# Patient Record
Sex: Male | Born: 1985 | Hispanic: Yes | Smoking: Current every day smoker
Health system: Southern US, Community
[De-identification: ages and names within clinical notes are randomized; demographics above are authoritative.]

## PROBLEM LIST (undated history)

## (undated) DIAGNOSIS — E785 Hyperlipidemia, unspecified: Secondary | ICD-10-CM

## (undated) DIAGNOSIS — I10 Essential (primary) hypertension: Secondary | ICD-10-CM

---

## 2020-07-19 ENCOUNTER — Encounter: Payer: Self-pay | Admitting: Emergency Medicine

## 2020-07-19 ENCOUNTER — Other Ambulatory Visit: Payer: Self-pay

## 2020-07-19 ENCOUNTER — Emergency Department: Payer: Self-pay

## 2020-07-19 DIAGNOSIS — F17210 Nicotine dependence, cigarettes, uncomplicated: Secondary | ICD-10-CM | POA: Insufficient documentation

## 2020-07-19 DIAGNOSIS — Z20822 Contact with and (suspected) exposure to covid-19: Secondary | ICD-10-CM | POA: Insufficient documentation

## 2020-07-19 DIAGNOSIS — I10 Essential (primary) hypertension: Secondary | ICD-10-CM | POA: Insufficient documentation

## 2020-07-19 DIAGNOSIS — R0789 Other chest pain: Secondary | ICD-10-CM | POA: Insufficient documentation

## 2020-07-19 LAB — BASIC METABOLIC PANEL
Anion gap: 12 (ref 5–15)
BUN: 18 mg/dL (ref 6–20)
CO2: 21 mmol/L — ABNORMAL LOW (ref 22–32)
Calcium: 8.6 mg/dL — ABNORMAL LOW (ref 8.9–10.3)
Chloride: 107 mmol/L (ref 98–111)
Creatinine, Ser: 0.75 mg/dL (ref 0.61–1.24)
GFR, Estimated: >60 mL/min (ref >60)
Glucose, Bld: 103 mg/dL — ABNORMAL HIGH (ref 70–99)
Potassium: 3.8 mmol/L (ref 3.5–5.1)
Sodium: 140 mmol/L (ref 135–145)

## 2020-07-19 LAB — CBC
HCT: 43 % (ref 39.0–52.0)
Hemoglobin: 15.3 g/dL (ref 13.0–17.0)
MCH: 31.5 pg (ref 26.0–34.0)
MCHC: 35.6 g/dL (ref 30.0–36.0)
MCV: 88.7 fL (ref 80.0–100.0)
Platelets: 300 10*3/uL (ref 150–400)
RBC: 4.85 MIL/uL (ref 4.22–5.81)
RDW: 12 % (ref 11.5–15.5)
WBC: 10.4 10*3/uL (ref 4.0–10.5)
nRBC: 0 % (ref 0.0–0.2)

## 2020-07-19 LAB — TROPONIN I (HIGH SENSITIVITY)
Troponin I (High Sensitivity): 5 ng/L (ref <18)
Troponin I (High Sensitivity): 5 ng/L (ref <18)

## 2020-07-19 NOTE — ED Triage Notes (Signed)
Patient to ER for c/o chest pain (mid-left) since Saturday. 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 States pain feels worse to him today. Denies any shortness of breath, nausea, or diaphoresis.

## 2020-07-20 ENCOUNTER — Emergency Department
Admission: EM | Admit: 2020-07-20 | Discharge: 2020-07-20 | Disposition: A | Discharge status: Home / Self Care | Payer: Self-pay | Attending: Emergency Medicine | Admitting: Emergency Medicine

## 2020-07-20 DIAGNOSIS — R079 Chest pain, unspecified: Secondary | ICD-10-CM

## 2020-07-20 DIAGNOSIS — I10 Essential (primary) hypertension: Secondary | ICD-10-CM

## 2020-07-20 HISTORY — DX: Essential (primary) hypertension: I10

## 2020-07-20 HISTORY — DX: Hyperlipidemia, unspecified: E78.5

## 2020-07-20 LAB — HEPATIC FUNCTION PANEL
ALT: 22 U/L (ref 0–44)
AST: 22 U/L (ref 15–41)
Albumin: 4 g/dL (ref 3.5–5.0)
Alkaline Phosphatase: 77 U/L (ref 38–126)
Bilirubin, Direct: 0.2 mg/dL (ref 0.0–0.2)
Indirect Bilirubin: 0.8 mg/dL (ref 0.3–0.9)
Total Bilirubin: 1 mg/dL (ref 0.3–1.2)
Total Protein: 6.7 g/dL (ref 6.5–8.1)

## 2020-07-20 LAB — RESPIRATORY PANEL BY RT PCR (FLU A&B, COVID)
Influenza A by PCR: NEGATIVE
Influenza B by PCR: NEGATIVE
SARS Coronavirus 2 by RT PCR: NEGATIVE

## 2020-07-20 LAB — LIPASE, BLOOD: Lipase: 30 U/L (ref 11–51)

## 2020-07-20 LAB — FIBRIN DERIVATIVES D-DIMER (ARMC ONLY): Fibrin derivatives D-dimer (ARMC): 242.23 ng/mL (FEU) (ref 0.00–499.00)

## 2020-07-20 MED ORDER — FAMOTIDINE IN NACL 20-0.9 MG/50ML-% IV SOLN
20.0000 mg | Freq: Once | INTRAVENOUS | Status: AC
  Administered 2020-07-20: 20 mg via INTRAVENOUS
  Filled 2020-07-20: qty 50

## 2020-07-20 MED ORDER — KETOROLAC TROMETHAMINE 30 MG/ML IJ SOLN
15.0000 mg | Freq: Once | INTRAMUSCULAR | Status: AC
  Administered 2020-07-20: 15 mg via INTRAVENOUS
  Filled 2020-07-20: qty 1

## 2020-07-20 MED ORDER — IBUPROFEN 800 MG PO TABS: 800.0000 mg | ORAL_TABLET | Freq: Three times a day (TID) | ORAL | 0 refills | Status: AC | PRN

## 2020-07-20 MED ORDER — HYDROCODONE-ACETAMINOPHEN 5-325 MG PO TABS: 1.0000 | ORAL_TABLET | Freq: Four times a day (QID) | ORAL | 0 refills | Status: AC | PRN

## 2020-07-20 MED ORDER — ATORVASTATIN CALCIUM 20 MG PO TABS: 20.0000 mg | ORAL_TABLET | Freq: Every day | ORAL | 0 refills | Status: AC

## 2020-07-20 MED ORDER — AMLODIPINE BESYLATE 5 MG PO TABS: 5.0000 mg | ORAL_TABLET | Freq: Every day | ORAL | 0 refills | Status: AC

## 2020-07-20 NOTE — ED Notes (Signed)
Pt reports pain decreased from 8/10 to 0/10 after Toradol.  Pt able to move left arm without increase in pain, no pain with deep breath.  PIV removed intact with site covered, no bleeding noted.  VSS.  Pt given work note to RTW on 07/21/20

## 2020-07-20 NOTE — ED Notes (Signed)
Pt lying awake in bed, responds appropriately to verbal stimuli.  Pt c/o 8/10 pain from under left arm to base of shoulder.  Patient sts that pain radiates into back, not into shoulder, pt with increased pain with palpation of chest wall close to armpit.  Pt denies n/v, no diaphoresis, no change or increase in pain with eating or lifting objects.  Pt is A&Ox4, MD aware of pain, pt denies change in pain after pepcid IV complete.

## 2020-07-20 NOTE — Discharge Instructions (Signed)
1.  You may take pain medicines as needed (Motrin/Norco #15). 2.  Apply moist heat to affected area several times daily. 3.  Start Amlodipine 5 mg daily for blood pressure. 4.  Start Lipitor 20 mg daily for cholesterol. 5.  Return to the ER for worsening symptoms, persistent vomiting, difficulty breathing or other concerns.

## 2020-08-07 NOTE — ED Provider Notes (Signed)
First MD Initiated Contact with Patient 07/20/20 0154     (approximate)  I have reviewed the triage vital signs and the nursing notes.   HISTORY  Chief Complaint Chest Pain    HPI Ricardo Mason is a 34 y.o. male who presents to the ED from home with a chief complaint of chest pain.  Patient has a history of hypertension and hyperlipidemia he has not taking his medicines in 7 to 8 months.  Reports left-sided chest pain x4 days, waxing/waning.  Describes pain as burning and aching, nonradiating, not associated with diaphoresis, shortness of breath, nausea/vomiting or dizziness.  Denies recent fever, chills, cough, abdominal pain, dysuria, diarrhea.  Denies recent travel or trauma.         Past Medical History:  Diagnosis Date  . Hyperlipidemia   . Hypertension     There are no problems to display for this patient.   History reviewed. No pertinent surgical history.  Prior to Admission medications   Not on File    Allergies Patient has no known allergies.  No family history on file.  Social History Social History        Tobacco Use  . Smoking status: Current Every Day Smoker    Types: Cigarettes  . Smokeless tobacco: Never Used  Substance Use Topics  . Alcohol use: Yes  . Drug use: Not on file    Review of Systems  Constitutional: No fever/chills Eyes: No visual changes. ENT: No sore throat. Cardiovascular: Positive for chest pain. Respiratory: Denies shortness of breath. Gastrointestinal: No abdominal pain.  No nausea, no vomiting.  No diarrhea.  No constipation. Genitourinary: Negative for dysuria. Musculoskeletal: Negative for back pain. Skin: Negative for rash. Neurological: Negative for headaches, focal weakness or numbness.   ____________________________________________   PHYSICAL EXAM:  VITAL SIGNS:    ED Triage Vitals [07/19/20 1540]  Enc Vitals Group     BP (!) 143/91     Pulse Rate (!) 110     Resp 20      Temp 98.2 F (36.8 C)     Temp Source Oral     SpO2 94 %     Weight 285 lb (129.3 kg)     Height 5\' 11"  (1.803 m)     Head Circumference      Peak Flow      Pain Score 8     Pain Loc      Pain Edu?      Excl. in GC?     Constitutional: Alert and oriented. Well appearing and in no acute distress. Eyes: Conjunctivae are normal. PERRL. EOMI. Head: Atraumatic. Nose: No congestion/rhinnorhea. Mouth/Throat: Mucous membranes are moist.   Neck: No stridor.   Cardiovascular: Normal rate, regular rhythm. Grossly normal heart sounds.  Good peripheral circulation. Respiratory: Normal respiratory effort.  No retractions. Lungs CTAB. Gastrointestinal: Soft and nontender to light or deep palpation. No distention. No abdominal bruits. No CVA tenderness. Musculoskeletal: No lower extremity tenderness nor edema.  No joint effusions. Neurologic:  Normal speech and language. No gross focal neurologic deficits are appreciated. No gait instability. Skin:  Skin is warm, dry and intact. No rash noted. Psychiatric: Mood and affect are normal. Speech and behavior are normal.  ____________________________________________   LABS (all labs ordered are listed, but only abnormal results are displayed)       Labs Reviewed  BASIC METABOLIC PANEL - Abnormal; Notable for the following components:      Result Value    CO2 21 (*)  Glucose, Bld 103 (*)    Calcium 8.6 (*)    All other components within normal limits  CBC  HEPATIC FUNCTION PANEL  LIPASE, BLOOD  FIBRIN DERIVATIVES D-DIMER ([facility] ONLY)  TROPONIN I (HIGH SENSITIVITY)  TROPONIN I (HIGH SENSITIVITY)   ____________________________________________  EKG  ED ECG REPORT I, Kedar Sedano J, the attending physician, personally viewed and interpreted this ECG.   Date: 07/20/2020  EKG Time: 1528  Rate: 113  Rhythm: sinus tachycardia  Axis: Normal  Intervals:none  ST&T Change:  Nonspecific  ____________________________________________  RADIOLOGY I, Kali Ambler J, personally viewed and evaluated these images (plain radiographs) as part of my medical decision making, as well as reviewing the written report by the radiologist.  ED MD interpretation: No acute cardiopulmonary process  Official radiology report(s): DG Chest 2 View  Result Date: 07/19/2020 CLINICAL DATA:  Chest pain. EXAM: CHEST - 2 VIEW COMPARISON:  None. FINDINGS: The heart size and mediastinal contours are within normal limits. Both lungs are clear. No visible pleural effusions or pneumothorax. The visualized skeletal structures are unremarkable. IMPRESSION: No active cardiopulmonary disease. Electronically Signed   By: Feliberto HartsFrederick S Jones MD   On: 07/19/2020 16:19    ____________________________________________   PROCEDURES  Procedure(s) performed (including Critical Care):  Procedures   ____________________________________________   INITIAL IMPRESSION / ASSESSMENT AND PLAN / ED COURSE  As part of my medical decision making, I reviewed the following data within the electronic MEDICAL RECORD NUMBER Nursing notes reviewed and incorporated, Labs reviewed, EKG interpreted, Old chart reviewed, Radiograph reviewed and Notes from prior ED visits     34 year old male with hypertension and hyperlipidemia not on meds who presents with a 4-day history of chest pain. Differential diagnosis includes, but is not limited to, ACS, aortic dissection, pulmonary embolism, cardiac tamponade, pneumothorax, pneumonia, pericarditis, myocarditis, GI-related causes including esophagitis/gastritis, and musculoskeletal chest wall pain.    EKG and 2 sets of troponins unremarkable.  Will check LFTs/lipase, D-dimer, Covid swab.  Administer IV Pepcid and reassess.    ____________________________________________   FINAL CLINICAL IMPRESSION(S) / ED DIAGNOSES  Final diagnoses:  Nonspecific chest pain         ED Discharge Orders    None      *Please note:  Ricardo Mason was evaluated in Emergency Department on 07/20/2020 for the symptoms described in the history of present illness. He was evaluated in the context of the global COVID-19 pandemic, which necessitated consideration that the patient might be at risk for infection with the SARS-CoV-2 virus that causes COVID-19. Institutional protocols and algorithms that pertain to the evaluation of patients at risk for COVID-19 are in a state of rapid change based on information released by regulatory bodies including the CDC and federal and state organizations. These policies and algorithms were followed during the patient's care in the ED.  Some ED evaluations and interventions may be delayed as a result of limited staffing during and the pandemic.*   Note:  This document was prepared using Dragon voice recognition software and may include unintentional dictation errors.     First MD Initiated Contact with Patient 07/20/20 0154     (approximate)  I have reviewed the triage vital signs and the nursing notes.   HISTORY  Chief Complaint Chest Pain    HPI Ricardo Mason is a 34 y.o. male who presents to the ED from home with a chief complaint of chest pain.  Patient has a history of hypertension and hyperlipidemia he has not taking his medicines in 7  to 8 months.  Reports left-sided chest pain x4 days, waxing/waning.  Describes pain as burning and aching, nonradiating, not associated with diaphoresis, shortness of breath, nausea/vomiting or dizziness.  Denies recent fever, chills, cough, abdominal pain, dysuria, diarrhea.  Denies recent travel or trauma.         Past Medical History:  Diagnosis Date  . Hyperlipidemia   . Hypertension     There are no problems to display for this patient.   History reviewed. No pertinent surgical history.  Prior to Admission medications   Not on File    Allergies Patient  has no known allergies.  No family history on file.  Social History Social History        Tobacco Use  . Smoking status: Current Every Day Smoker    Types: Cigarettes  . Smokeless tobacco: Never Used  Substance Use Topics  . Alcohol use: Yes  . Drug use: Not on file    Review of Systems  Constitutional: No fever/chills Eyes: No visual changes. ENT: No sore throat. Cardiovascular: Positive for chest pain. Respiratory: Denies shortness of breath. Gastrointestinal: No abdominal pain.  No nausea, no vomiting.  No diarrhea.  No constipation. Genitourinary: Negative for dysuria. Musculoskeletal: Negative for back pain. Skin: Negative for rash. Neurological: Negative for headaches, focal weakness or numbness.   ____________________________________________   PHYSICAL EXAM:  VITAL SIGNS:    ED Triage Vitals [07/19/20 1540]  Enc Vitals Group     BP (!) 143/91     Pulse Rate (!) 110     Resp 20     Temp 98.2 F (36.8 C)     Temp Source Oral     SpO2 94 %     Weight 285 lb (129.3 kg)     Height 5\' 11"  (1.803 m)     Head Circumference      Peak Flow      Pain Score 8     Pain Loc      Pain Edu?      Excl. in GC?     Constitutional: Alert and oriented. Well appearing and in no acute distress. Eyes: Conjunctivae are normal. PERRL. EOMI. Head: Atraumatic. Nose: No congestion/rhinnorhea. Mouth/Throat: Mucous membranes are moist.   Neck: No stridor.   Cardiovascular: Normal rate, regular rhythm. Grossly normal heart sounds.  Good peripheral circulation. Respiratory: Normal respiratory effort.  No retractions. Lungs CTAB. Gastrointestinal: Soft and nontender to light or deep palpation. No distention. No abdominal bruits. No CVA tenderness. Musculoskeletal: No lower extremity tenderness nor edema.  No joint effusions. Neurologic:  Normal speech and language. No gross focal neurologic deficits are appreciated. No gait instability. Skin:  Skin is  warm, dry and intact. No rash noted. Psychiatric: Mood and affect are normal. Speech and behavior are normal.  ____________________________________________   LABS (all labs ordered are listed, but only abnormal results are displayed)       Labs Reviewed  BASIC METABOLIC PANEL - Abnormal; Notable for the following components:      Result Value    CO2 21 (*)    Glucose, Bld 103 (*)    Calcium 8.6 (*)    All other components within normal limits  CBC  HEPATIC FUNCTION PANEL  LIPASE, BLOOD  FIBRIN DERIVATIVES D-DIMER ([facility] ONLY)  TROPONIN I (HIGH SENSITIVITY)  TROPONIN I (HIGH SENSITIVITY)   ____________________________________________  EKG  ED ECG REPORT I, Haruki Arnold J, the attending physician, personally viewed and interpreted this ECG.   Date: 07/20/2020  EKG Time: 1528  Rate: 113  Rhythm: sinus tachycardia  Axis: Normal  Intervals:none  ST&T Change: Nonspecific  ____________________________________________  RADIOLOGY I, Rayel Santizo J, personally viewed and evaluated these images (plain radiographs) as part of my medical decision making, as well as reviewing the written report by the radiologist.  ED MD interpretation: No acute cardiopulmonary process  Official radiology report(s): DG Chest 2 View  Result Date: 07/19/2020 CLINICAL DATA:  Chest pain. EXAM: CHEST - 2 VIEW COMPARISON:  None. FINDINGS: The heart size and mediastinal contours are within normal limits. Both lungs are clear. No visible pleural effusions or pneumothorax. The visualized skeletal structures are unremarkable. IMPRESSION: No active cardiopulmonary disease. Electronically Signed   By: Feliberto Harts MD   On: 07/19/2020 16:19    ____________________________________________   PROCEDURES  Procedure(s) performed (including Critical Care):  Procedures   ____________________________________________   INITIAL IMPRESSION / ASSESSMENT AND PLAN / ED COURSE  As  part of my medical decision making, I reviewed the following data within the electronic MEDICAL RECORD NUMBER Nursing notes reviewed and incorporated, Labs reviewed, EKG interpreted, Old chart reviewed, Radiograph reviewed and Notes from prior ED visits     34 year old male with hypertension and hyperlipidemia not on meds who presents with a 4-day history of chest pain. Differential diagnosis includes, but is not limited to, ACS, aortic dissection, pulmonary embolism, cardiac tamponade, pneumothorax, pneumonia, pericarditis, myocarditis, GI-related causes including esophagitis/gastritis, and musculoskeletal chest wall pain.    EKG and 2 sets of troponins unremarkable.  Will check LFTs/lipase, D-dimer, Covid swab.  Administer IV Pepcid and reassess.  11/03 0436 Updated patient on all test results. I am unable to find which medications he was taking for hypertension and patient not recall. Will start low-dose Amlodipine, Lipitor for cholesterol and refer him to cardiology for follow-up. Strict return precautions given. Patient verbalizes understanding and agrees with plan of care.  ____________________________________________   FINAL CLINICAL IMPRESSION(S) / ED DIAGNOSES  Final diagnoses:  Nonspecific chest pain        ED Discharge Orders    None      *Please note:  Ricardo Mason was evaluated in Emergency Department on 07/20/2020 for the symptoms described in the history of present illness. He was evaluated in the context of the global COVID-19 pandemic, which necessitated consideration that the patient might be at risk for infection with the SARS-CoV-2 virus that causes COVID-19. Institutional protocols and algorithms that pertain to the evaluation of patients at risk for COVID-19 are in a state of rapid change based on information released by regulatory bodies including the CDC and federal and state organizations. These policies and algorithms were followed during the patient's care  in the ED.  Some ED evaluations and interventions may be delayed as a result of limited staffing during and the pandemic.*   Note:  This document was prepared using Dragon voice recognition software and may include unintentional dictation errors.        Irean Hong, MD 08/07/20 [phone]

## 2022-03-21 IMAGING — CR DG CHEST 2V
2 series · 2 of 2 positions shown · non-contrast
Comparison: None.

CLINICAL DATA: Chest pain.

EXAM:
CHEST - 2 VIEW

[chest pa]
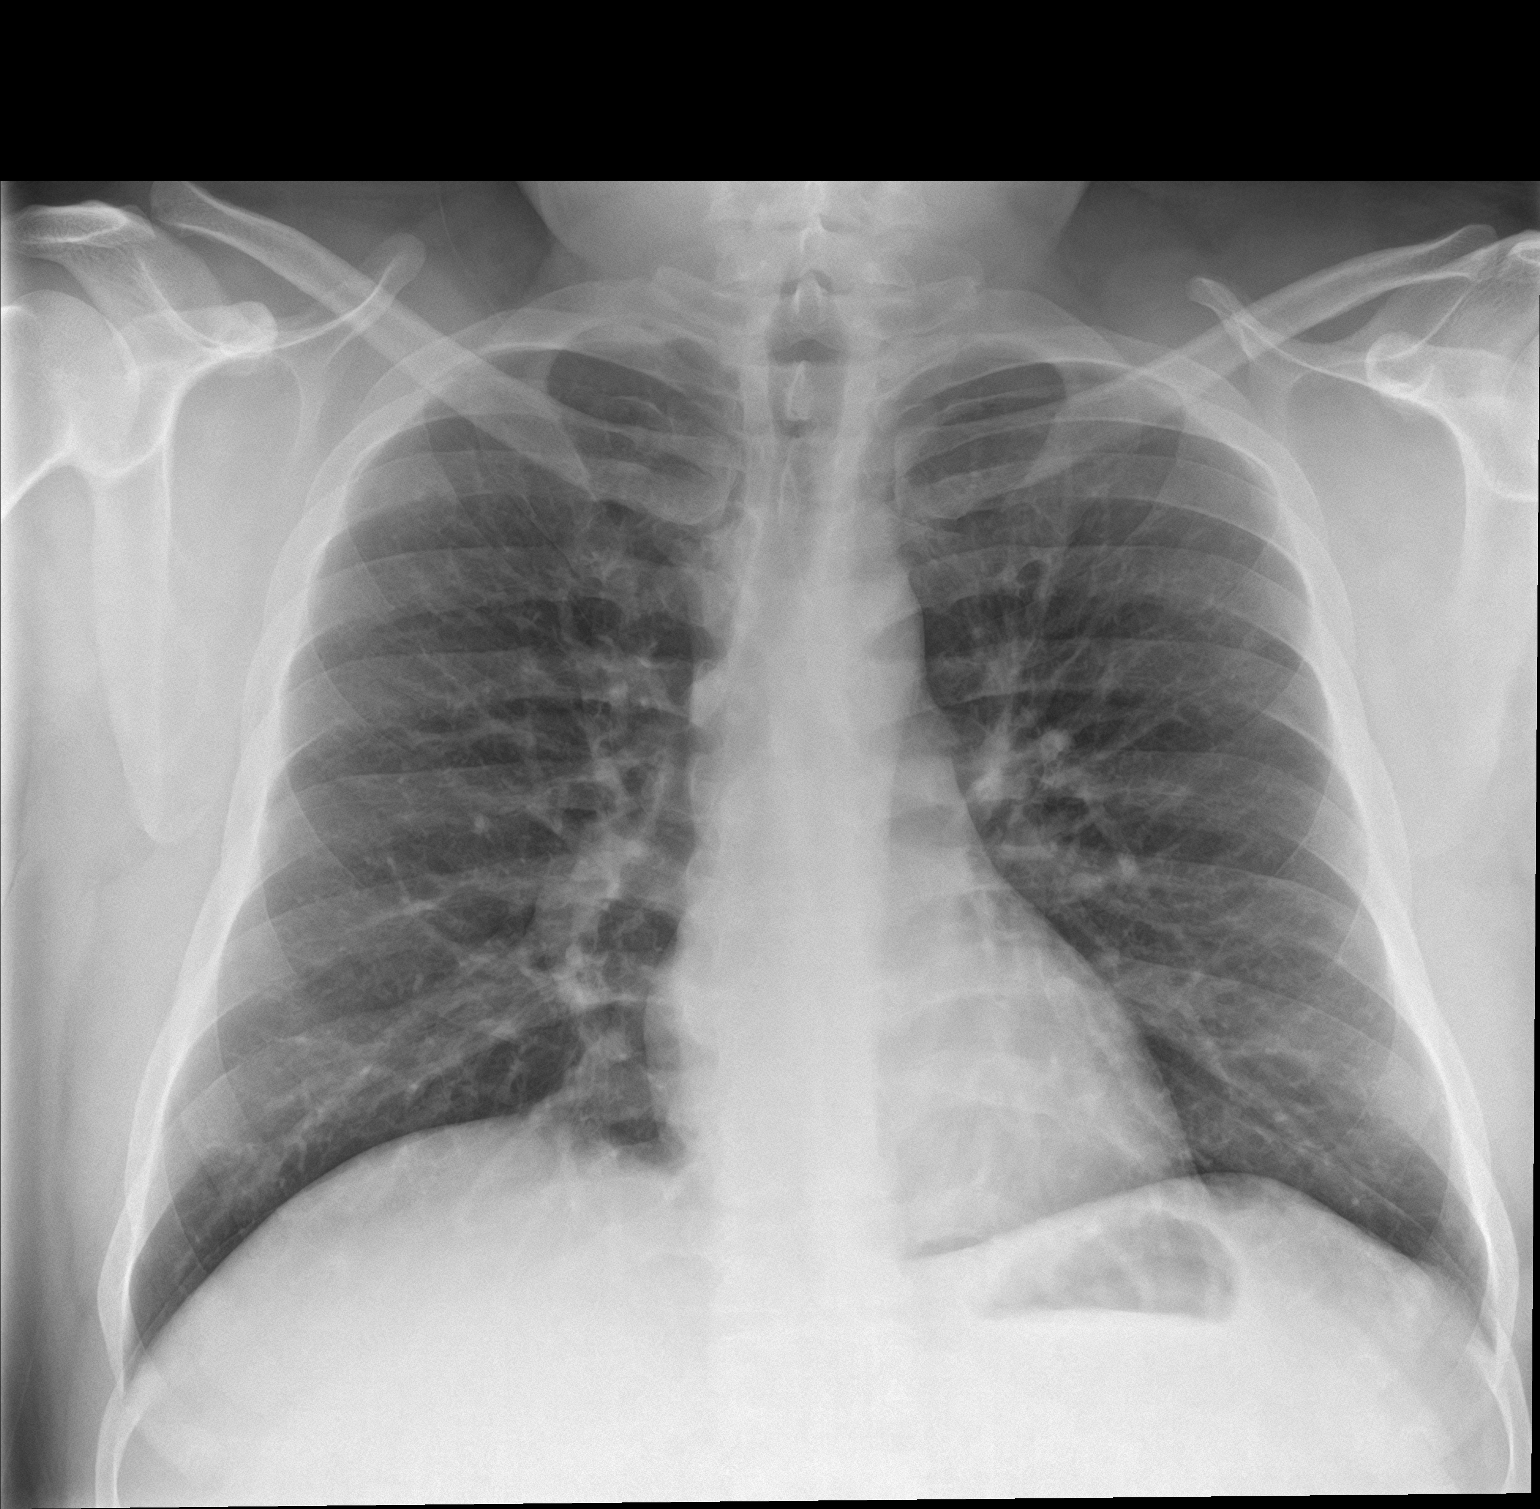

[chest lat]
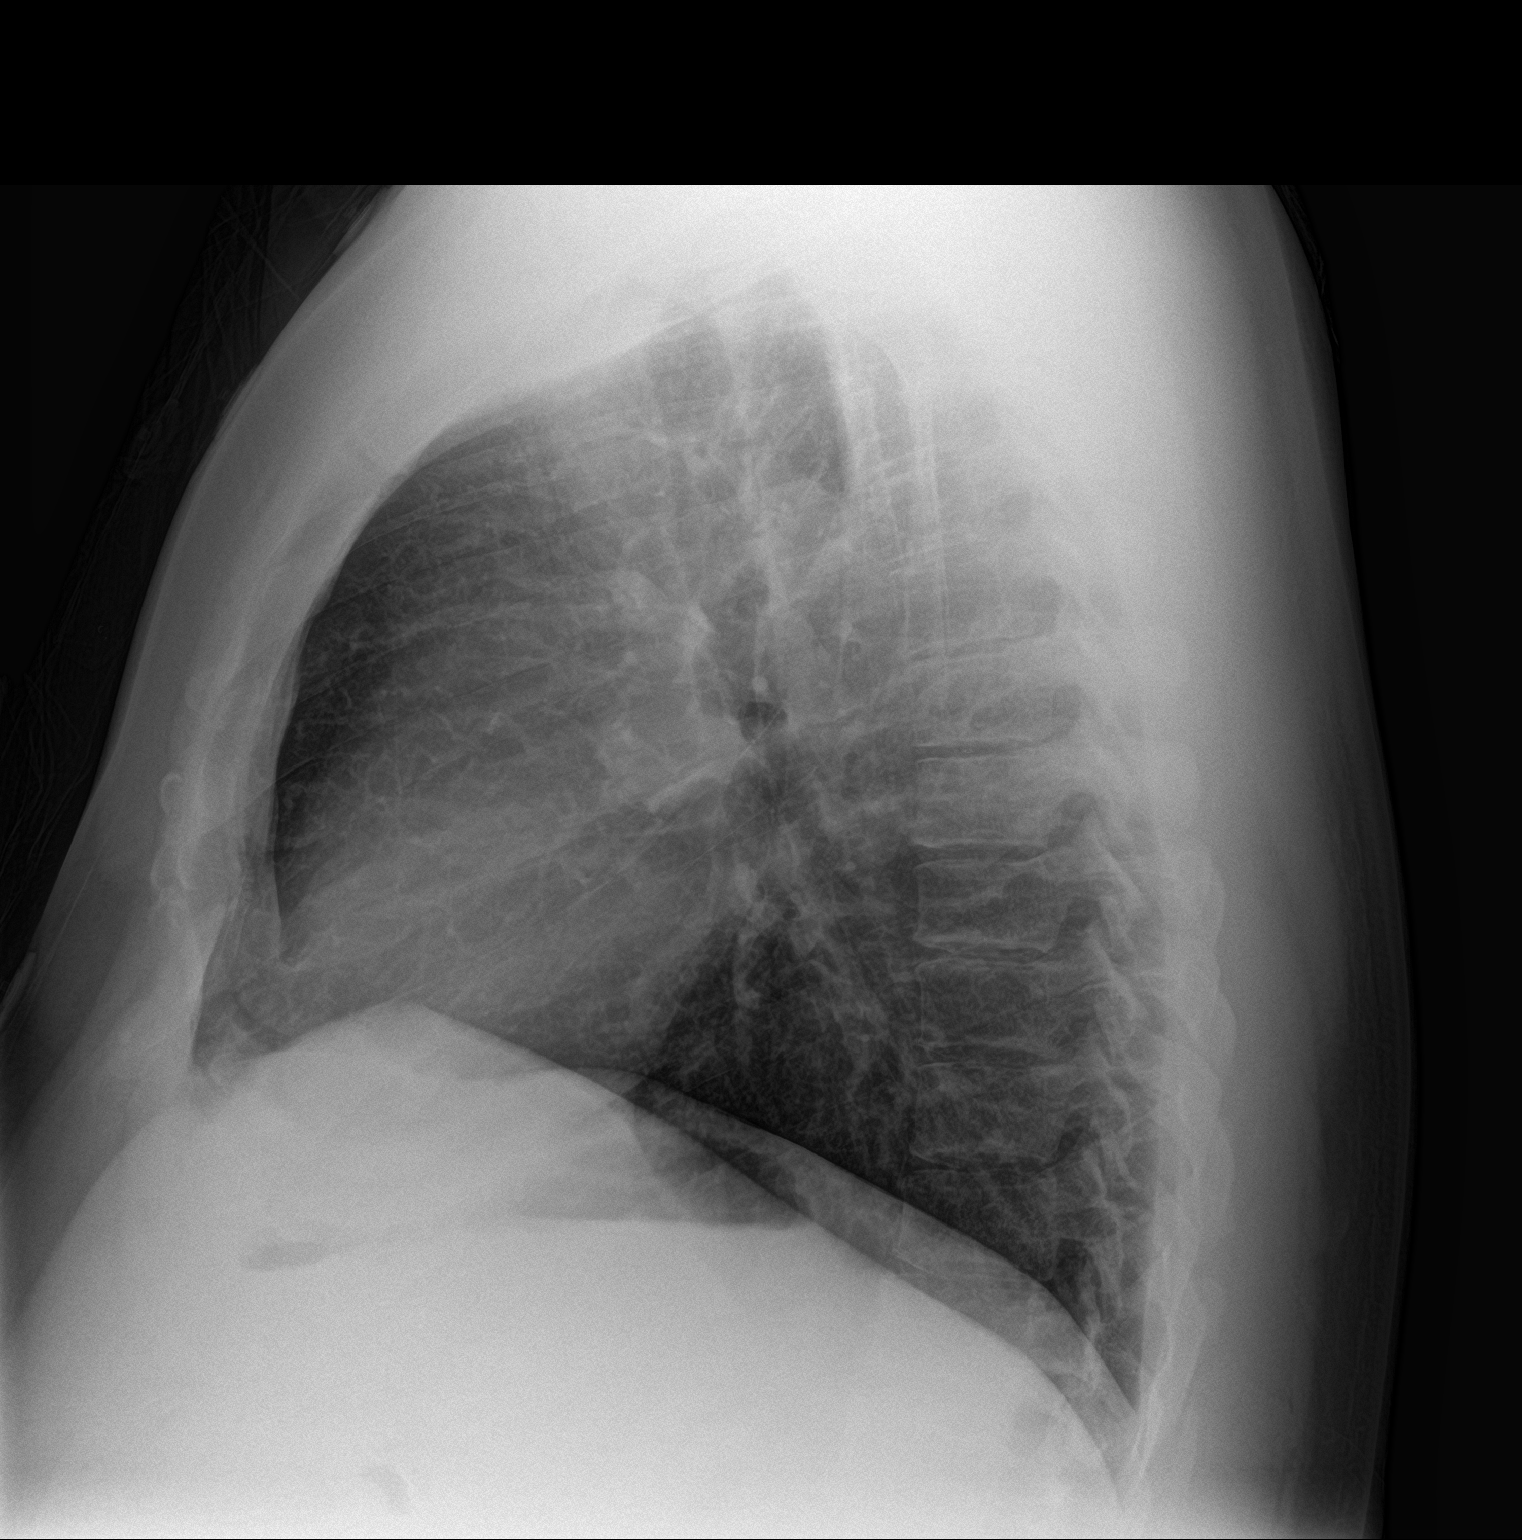

[2 of 2 positions shown; findings below may reference images not displayed]

FINDINGS: The heart size and mediastinal contours are within normal limits.
Both lungs are clear. No visible pleural effusions or pneumothorax.
The visualized skeletal structures are unremarkable.
IMPRESSION: No active cardiopulmonary disease.
